# Patient Record
Sex: Female | Born: 1965 | Race: White | Hispanic: No | Smoking: Current every day smoker
Health system: Southern US, Community
[De-identification: ages and names within clinical notes are randomized; demographics above are authoritative.]

## PROBLEM LIST (undated history)

## (undated) DIAGNOSIS — I10 Essential (primary) hypertension: Secondary | ICD-10-CM

## (undated) DIAGNOSIS — J189 Pneumonia, unspecified organism: Secondary | ICD-10-CM

## (undated) HISTORY — PX: BACK SURGERY: SHX140

## (undated) HISTORY — PX: FOOT SURGERY: SHX648

---

## 2005-02-11 ENCOUNTER — Emergency Department: Payer: Self-pay | Admitting: Emergency Medicine

## 2005-04-17 ENCOUNTER — Emergency Department: Payer: Self-pay | Admitting: Emergency Medicine

## 2005-05-01 ENCOUNTER — Ambulatory Visit: Payer: Self-pay | Admitting: Orthopedic Surgery

## 2006-03-14 ENCOUNTER — Ambulatory Visit: Payer: Self-pay | Admitting: Family Medicine

## 2007-09-24 ENCOUNTER — Ambulatory Visit: Payer: Self-pay | Admitting: Pain Medicine

## 2008-01-13 ENCOUNTER — Ambulatory Visit: Payer: Self-pay | Admitting: Orthopedic Surgery

## 2008-06-08 ENCOUNTER — Ambulatory Visit: Payer: Self-pay | Admitting: Orthopedic Surgery

## 2008-06-11 ENCOUNTER — Ambulatory Visit: Payer: Self-pay | Admitting: Orthopedic Surgery

## 2009-06-13 ENCOUNTER — Emergency Department: Payer: Self-pay | Admitting: Emergency Medicine

## 2009-08-18 ENCOUNTER — Ambulatory Visit: Payer: Self-pay | Admitting: Orthopaedic Surgery

## 2010-07-11 ENCOUNTER — Ambulatory Visit: Payer: Self-pay | Admitting: Orthopaedic Surgery

## 2011-01-21 ENCOUNTER — Ambulatory Visit: Payer: Self-pay | Admitting: Orthopaedic Surgery

## 2011-10-11 ENCOUNTER — Ambulatory Visit: Payer: Self-pay | Admitting: Orthopaedic Surgery

## 2012-04-04 ENCOUNTER — Ambulatory Visit: Payer: Self-pay | Admitting: Orthopaedic Surgery

## 2012-09-21 ENCOUNTER — Ambulatory Visit: Payer: Self-pay

## 2015-05-23 ENCOUNTER — Emergency Department
Admission: EM | Admit: 2015-05-23 | Discharge: 2015-05-23 | Disposition: A | Discharge status: Home / Self Care | Payer: Medicare Other | Attending: Emergency Medicine | Admitting: Emergency Medicine

## 2015-05-23 ENCOUNTER — Other Ambulatory Visit: Payer: Self-pay

## 2015-05-23 ENCOUNTER — Encounter: Payer: Self-pay | Admitting: Emergency Medicine

## 2015-05-23 ENCOUNTER — Emergency Department: Payer: Medicare Other

## 2015-05-23 DIAGNOSIS — R0600 Dyspnea, unspecified: Secondary | ICD-10-CM | POA: Diagnosis present

## 2015-05-23 DIAGNOSIS — J209 Acute bronchitis, unspecified: Secondary | ICD-10-CM | POA: Diagnosis not present

## 2015-05-23 DIAGNOSIS — I10 Essential (primary) hypertension: Secondary | ICD-10-CM | POA: Diagnosis not present

## 2015-05-23 DIAGNOSIS — Z720 Tobacco use: Secondary | ICD-10-CM | POA: Insufficient documentation

## 2015-05-23 HISTORY — DX: Essential (primary) hypertension: I10

## 2015-05-23 HISTORY — DX: Pneumonia, unspecified organism: J18.9

## 2015-05-23 LAB — CBC WITH DIFFERENTIAL/PLATELET
BASOS ABS: 0 10*3/uL (ref 0–0.1)
BASOS PCT: 0 %
EOS ABS: 0 10*3/uL (ref 0–0.7)
Eosinophils Relative: 0 %
HCT: 42.4 % (ref 35.0–47.0)
HEMOGLOBIN: 14.2 g/dL (ref 12.0–16.0)
Lymphocytes Relative: 13 %
Lymphs Abs: 1.4 10*3/uL (ref 1.0–3.6)
MCH: 30.1 pg (ref 26.0–34.0)
MCHC: 33.4 g/dL (ref 32.0–36.0)
MCV: 90.1 fL (ref 80.0–100.0)
Monocytes Absolute: 0.1 10*3/uL — ABNORMAL LOW (ref 0.2–0.9)
Monocytes Relative: 1 %
NEUTROS PCT: 86 %
Neutro Abs: 9.1 10*3/uL — ABNORMAL HIGH (ref 1.4–6.5)
Platelets: 258 10*3/uL (ref 150–440)
RBC: 4.71 MIL/uL (ref 3.80–5.20)
RDW: 13.7 % (ref 11.5–14.5)
WBC: 10.6 10*3/uL (ref 3.6–11.0)

## 2015-05-23 LAB — COMPREHENSIVE METABOLIC PANEL
ALBUMIN: 4.9 g/dL (ref 3.5–5.0)
ALK PHOS: 115 U/L (ref 38–126)
ALT: 23 U/L (ref 14–54)
ANION GAP: 10 (ref 5–15)
AST: 24 U/L (ref 15–41)
BUN: 12 mg/dL (ref 6–20)
CALCIUM: 9.8 mg/dL (ref 8.9–10.3)
CO2: 25 mmol/L (ref 22–32)
Chloride: 106 mmol/L (ref 101–111)
Creatinine, Ser: 0.91 mg/dL (ref 0.44–1.00)
GFR calc Af Amer: >60 mL/min (ref >60)
GFR calc non Af Amer: >60 mL/min (ref >60)
GLUCOSE: 126 mg/dL — AB (ref 65–99)
Potassium: 3.8 mmol/L (ref 3.5–5.1)
Sodium: 141 mmol/L (ref 135–145)
Total Bilirubin: 0.2 mg/dL — ABNORMAL LOW (ref 0.3–1.2)
Total Protein: 8.4 g/dL — ABNORMAL HIGH (ref 6.5–8.1)

## 2015-05-23 LAB — FIBRIN DERIVATIVES D-DIMER (ARMC ONLY): Fibrin derivatives D-dimer (ARMC): 296 (ref 0–499)

## 2015-05-23 LAB — LACTIC ACID, PLASMA: Lactic Acid, Venous: 1.7 mmol/L (ref 0.5–2.0)

## 2015-05-23 MED ORDER — METHYLPREDNISOLONE SODIUM SUCC 125 MG IJ SOLR
125.0000 mg | Freq: Once | INTRAMUSCULAR | Status: AC
  Administered 2015-05-23: 125 mg via INTRAVENOUS
  Filled 2015-05-23: qty 2

## 2015-05-23 MED ORDER — PREDNISONE 20 MG PO TABS: 20.0000 mg | ORAL_TABLET | Freq: Two times a day (BID) | ORAL | Status: AC

## 2015-05-23 MED ORDER — KETOROLAC TROMETHAMINE 30 MG/ML IJ SOLN
30.0000 mg | Freq: Once | INTRAMUSCULAR | Status: AC
  Administered 2015-05-23: 30 mg via INTRAVENOUS
  Filled 2015-05-23: qty 1

## 2015-05-23 MED ORDER — ALBUTEROL SULFATE HFA 108 (90 BASE) MCG/ACT IN AERS: 2.0000 | INHALATION_SPRAY | Freq: Four times a day (QID) | RESPIRATORY_TRACT | Status: AC | PRN

## 2015-05-23 MED ORDER — SODIUM CHLORIDE 0.9 % IV BOLUS (SEPSIS)
500.0000 mL | Freq: Once | INTRAVENOUS | Status: AC
  Administered 2015-05-23: 500 mL via INTRAVENOUS

## 2015-05-23 MED ORDER — AZITHROMYCIN 500 MG PO TABS: 500.0000 mg | ORAL_TABLET | Freq: Every day | ORAL | Status: AC

## 2015-05-23 MED ORDER — ONDANSETRON HCL 4 MG/2ML IJ SOLN
4.0000 mg | Freq: Once | INTRAMUSCULAR | Status: AC
  Administered 2015-05-23: 4 mg via INTRAVENOUS
  Filled 2015-05-23: qty 2

## 2015-05-23 MED ORDER — IPRATROPIUM-ALBUTEROL 0.5-2.5 (3) MG/3ML IN SOLN
3.0000 mL | Freq: Once | RESPIRATORY_TRACT | Status: AC
Start: 2015-05-23 — End: 2015-05-23
  Administered 2015-05-23: 3 mL via RESPIRATORY_TRACT
  Filled 2015-05-23: qty 3

## 2015-05-23 MED ORDER — HYDROMORPHONE HCL 1 MG/ML IJ SOLN
1.0000 mg | Freq: Once | INTRAMUSCULAR | Status: AC
  Administered 2015-05-23: 1 mg via INTRAVENOUS
  Filled 2015-05-23: qty 1

## 2015-05-23 NOTE — Discharge Instructions (Signed)
Upper Respiratory Infection, Adult °Most upper respiratory infections (URIs) are a viral infection of the air passages leading to the lungs. A URI affects the nose, throat, and upper air passages. The most common type of URI is nasopharyngitis and is typically referred to as "the common cold." °URIs run their course and usually go away on their own. Most of the time, a URI does not require medical attention, but sometimes a bacterial infection in the upper airways can follow a viral infection. This is called a secondary infection. Sinus and middle ear infections are common types of secondary upper respiratory infections. °Bacterial pneumonia can also complicate a URI. A URI can worsen asthma and chronic obstructive pulmonary disease (COPD). Sometimes, these complications can require emergency medical care and may be life threatening.  °CAUSES °Almost all URIs are caused by viruses. A virus is a type of germ and can spread from one person to another.  °RISKS FACTORS °You may be at risk for a URI if:  °· You smoke.   °· You have chronic heart or lung disease. °· You have a weakened defense (immune) system.   °· You are very young or very old.   °· You have nasal allergies or asthma. °· You work in crowded or poorly ventilated areas. °· You work in health care facilities or schools. °SIGNS AND SYMPTOMS  °Symptoms typically develop 2-3 days after you come in contact with a cold virus. Most viral URIs last 7-10 days. However, viral URIs from the influenza virus (flu virus) can last 14-18 days and are typically more severe. Symptoms may include:  °· Runny or stuffy (congested) nose.   °· Sneezing.   °· Cough.   °· Sore throat.   °· Headache.   °· Fatigue.   °· Fever.   °· Loss of appetite.   °· Pain in your forehead, behind your eyes, and over your cheekbones (sinus pain). °· Muscle aches.   °DIAGNOSIS  °Your health care provider may diagnose a URI by: °· Physical exam. °· Tests to check that your symptoms are not due to  another condition such as: °¨ Strep throat. °¨ Sinusitis. °¨ Pneumonia. °¨ Asthma. °TREATMENT  °A URI goes away on its own with time. It cannot be cured with medicines, but medicines may be prescribed or recommended to relieve symptoms. Medicines may help: °· Reduce your fever. °· Reduce your cough. °· Relieve nasal congestion. °HOME CARE INSTRUCTIONS  °· Take medicines only as directed by your health care provider.   °· Gargle warm saltwater or take cough drops to comfort your throat as directed by your health care provider. °· Use a warm mist humidifier or inhale steam from a shower to increase air moisture. This may make it easier to breathe. °· Drink enough fluid to keep your urine clear or pale yellow.   °· Eat soups and other clear broths and maintain good nutrition.   °· Rest as needed.   °· Return to work when your temperature has returned to normal or as your health care provider advises. You may need to stay home longer to avoid infecting others. You can also use a face mask and careful hand washing to prevent spread of the virus. °· Increase the usage of your inhaler if you have asthma.   °· Do not use any tobacco products, including cigarettes, chewing tobacco, or electronic cigarettes. If you need help quitting, ask your health care provider. °PREVENTION  °The best way to protect yourself from getting a cold is to practice good hygiene.  °· Avoid oral or hand contact with people with cold   symptoms.   °· Wash your hands often if contact occurs.   °There is no clear evidence that vitamin C, vitamin E, echinacea, or exercise reduces the chance of developing a cold. However, it is always recommended to get plenty of rest, exercise, and practice good nutrition.  °SEEK MEDICAL CARE IF:  °· You are getting worse rather than better.   °· Your symptoms are not controlled by medicine.   °· You have chills. °· You have worsening shortness of breath. °· You have brown or red mucus. °· You have yellow or brown nasal  discharge. °· You have pain in your face, especially when you bend forward. °· You have a fever. °· You have swollen neck glands. °· You have pain while swallowing. °· You have white areas in the back of your throat. °SEEK IMMEDIATE MEDICAL CARE IF:  °· You have severe or persistent: °¨ Headache. °¨ Ear pain. °¨ Sinus pain. °¨ Chest pain. °· You have chronic lung disease and any of the following: °¨ Wheezing. °¨ Prolonged cough. °¨ Coughing up blood. °¨ A change in your usual mucus. °· You have a stiff neck. °· You have changes in your: °¨ Vision. °¨ Hearing. °¨ Thinking. °¨ Mood. °MAKE SURE YOU:  °· Understand these instructions. °· Will watch your condition. °· Will get help right away if you are not doing well or get worse. °  °This information is not intended to replace advice given to you by your health care provider. Make sure you discuss any questions you have with your health care provider. °  °Document Released: 01/24/2001 Document Revised: 12/15/2014 Document Reviewed: 11/05/2013 °Elsevier Interactive Patient Education ©2016 [employer] ° °Please return immediately if condition worsens. Please contact her primary physician or the physician you were given for referral. If you have any specialist physicians involved in her treatment and plan please also contact them. Thank you for using [facility]. ° °

## 2015-05-23 NOTE — ED Notes (Addendum)
Arrived via EMS from Urgent Care on [facility]. Pt diagnosed with PNA in the left lower lobe this week and over the past few days increased sxs and difficulty breathing.  Lethargic on arrival. Has been on albuterol, atbx, and steroids since Thursday. Duoneb given by EMS.

## 2015-05-23 NOTE — ED Provider Notes (Signed)
Time Seen:----------------------------------------- 3:21 PM on 05/23/2015 -----------------------------------------     I have reviewed the triage notes  Chief Complaint: Respiratory Distress   History of Present Illness: Carly Griffin is a 49 y.o. female who presents in respiratory distress. Patient has been diagnosed with pneumonia and was started on Z-Pak, prednisone, albuterol, and Tessalon Perles. She states her shortness of breath got worse and she was referred here to the emergency department was transported by EMS. Prior to arrival she received a DuoNeb. She states she's feels symptomatically improved though is somewhat limited historian due to her shortness of breath. He describes posterior left-sided chest discomfort mainly with coughing. She denies any nausea, vomiting, fever today. She is not currently on any home oxygen therapy. She has history of tobacco usage but is not diagnosed with COPD. She states she smokes less than a pack of cigarettes per day. She's had a history of multiple surgeries on her lower extremities and also on her back.   Past Medical History  Diagnosis Date  . Pneumonia   . Hypertension     There are no active problems to display for this patient.   Past Surgical History  Procedure Laterality Date  . Back surgery    . Foot surgery      Past Surgical History  Procedure Laterality Date  . Back surgery    . Foot surgery      No current outpatient prescriptions on file.  Allergies:  Review of patient's allergies indicates no known allergies.  Family History: History reviewed. No pertinent family history.  Social History: Social History  Substance Use Topics  . Smoking status: Current Every Day Smoker -- 0.50 packs/day    Types: Cigarettes  . Smokeless tobacco: None  . Alcohol Use: No     Review of Systems:   10 point review of systems was performed and was otherwise negative:  Constitutional: No fever Eyes: No visual  disturbances ENT: No sore throat, ear pain Cardiac: Posterior left-sided chest pain Respiratory: No shortness of breath, wheezing, or stridor Abdomen: No abdominal pain, no vomiting, No diarrhea Endocrine: No weight loss, No night sweats Extremities: No peripheral edema, cyanosis. No calf tenderness or posterior swelling. Skin: No rashes, easy bruising Neurologic: No focal weakness, trouble with speech or swollowing Urologic: No dysuria, Hematuria, or urinary frequency   Physical Exam:  ED Triage Vitals  Enc Vitals Group     BP 05/23/15 1455 157/137 mmHg     Pulse Rate 05/23/15 1455 103     Resp 05/23/15 1455 16     Temp 05/23/15 1455 97.7 F (36.5 C)     Temp Source 05/23/15 1455 Oral     SpO2 05/23/15 1448 93 %     Weight 05/23/15 1509 180 lb 4.8 oz (81.784 kg)     Height 05/23/15 1509  (1.549 m)     Head Cir --      Peak Flow --      Pain Score --      Pain Loc --      Pain Edu? --      Excl. in GC? --     General: Awake , Alert , and Oriented times 3; GCS 15 Head: Normal cephalic , atraumatic Eyes: Pupils equal , round, reactive to light Nose/Throat: No nasal drainage, patent upper airway without erythema or exudate.  Neck: Supple, Full range of motion, No anterior adenopathy or palpable thyroid masses Lungs: Coarse rhonchi auscultated bilaterally right side greater than  the left. Breath sounds are symmetric. Mild and expiratory wheezing at the apices Heart: Regular rate, regular rhythm without murmurs , gallops , or rubs Abdomen: Soft, non tender without rebound, guarding , or rigidity; bowel sounds positive and symmetric in all 4 quadrants. No organomegaly .        Extremities: 2 plus symmetric pulses. No edema, clubbing or cyanosis Neurologic: normal ambulation, Motor symmetric without deficits, sensory intact Skin: warm, dry, no rashes   Labs:   All laboratory work was reviewed including any pertinent negatives or positives listed below:  Labs Reviewed   CULTURE, BLOOD (ROUTINE X 2)  CULTURE, BLOOD (ROUTINE X 2)  CBC WITH DIFFERENTIAL/PLATELET  COMPREHENSIVE METABOLIC PANEL  FIBRIN DERIVATIVES D-DIMER ([facility] ONLY)  LACTIC ACID, PLASMA  LACTIC ACID, PLASMA   laboratory work reviewed shows no significant abnormalities. D-dimer test was 296 and with a low clinical suspicion I felt further investigation was not necessary  EKG:  ED ECG REPORT I, Jennye Moccasin, the attending physician, personally viewed and interpreted this ECG.  Date: 05/23/2015 EKG Time: 1505 Rate: 104 Rhythm: normal sinus rhythm QRS Axis: Low voltage QRS Intervals: normal ST/T Wave abnormalities: normal Conduction Disutrbances: none Narrative Interpretation: unremarkable    Radiology:  EXAM: CHEST 2 VIEW  COMPARISON: 03/14/2006  FINDINGS: Heart size is normal. Mediastinal shadows are unremarkable. There is abnormal density both lung bases with volume loss and linear markings. The findings could represent scarring, atelectasis or bibasilar pneumonia. No dense consolidation or lobar collapse. No effusion. Spinal stimulator is seen in the lower thoracic region.  IMPRESSION: Volume loss at the lung bases with linear markings. The findings could represent scarring, sterile atelectasis or atelectatic bibasilar pneumonia.   I personally reviewed the radiologic studies    ED Course:  Patient was placed on continuous pulse oximetry and 2 L nasal cannula. She was given an additional DuoNeb prior to discharge and essentially was observed after her first DuoNeb treatment and was given a boost of her steroids. Review of her chest x-ray shows some small amount of platelike atelectasis in the left lower lobe with no indications of a significant infiltrate. Supplemental oxygen and observed with ambulation and the lowest pulse oximetry was 94% on room air. Patient I felt was on the appropriate treatment program and will continue her Zithromax for atypical  pneumonias versus pertussis  etc. She was also given an extension on her steroids and was given a prescription for another inhaler. Patient I felt did not require inpatient management. Her differential included acute bronchitis with bronchospasm, community-acquired pneumonia, pulmonary embolism, pulmonary edema. Given her current clinical presentation and objective findings I felt this was bronchitis with bronchospasm with a history of tobacco abuse.   Assessment: Acute bronchitis with bronchospasm     Plan: Outpatient management Patient was advised to return immediately if condition worsens. Patient was advised to follow up with her primary care physician or other specialized physicians involved and in their current assessment.             Jennye Moccasin, MD 05/23/15 [phone]

## 2015-05-28 LAB — CULTURE, BLOOD (ROUTINE X 2)
CULTURE: NO GROWTH
CULTURE: NO GROWTH

## 2016-03-30 ENCOUNTER — Other Ambulatory Visit: Payer: Self-pay | Admitting: Pain Medicine

## 2016-03-30 DIAGNOSIS — T85192A Other mechanical complication of implanted electronic neurostimulator (electrode) of spinal cord, initial encounter: Secondary | ICD-10-CM

## 2016-05-03 ENCOUNTER — Other Ambulatory Visit: Payer: Self-pay | Admitting: Pain Medicine

## 2016-05-03 DIAGNOSIS — IMO0002 Reserved for concepts with insufficient information to code with codable children: Secondary | ICD-10-CM

## 2016-05-05 ENCOUNTER — Ambulatory Visit: Admission: RE | Admit: 2016-05-05 | Payer: Medicare PPO | Source: Ambulatory Visit

## 2016-12-10 IMAGING — CR DG CHEST 2V
2 series · 2 of 2 positions shown · non-contrast
Comparison: 03/14/2006

CLINICAL DATA: Diagnosed with pneumonia last week with worsening
symptoms. Worsening shortness of breath.

EXAM:
CHEST  2 VIEW

[chest pa]
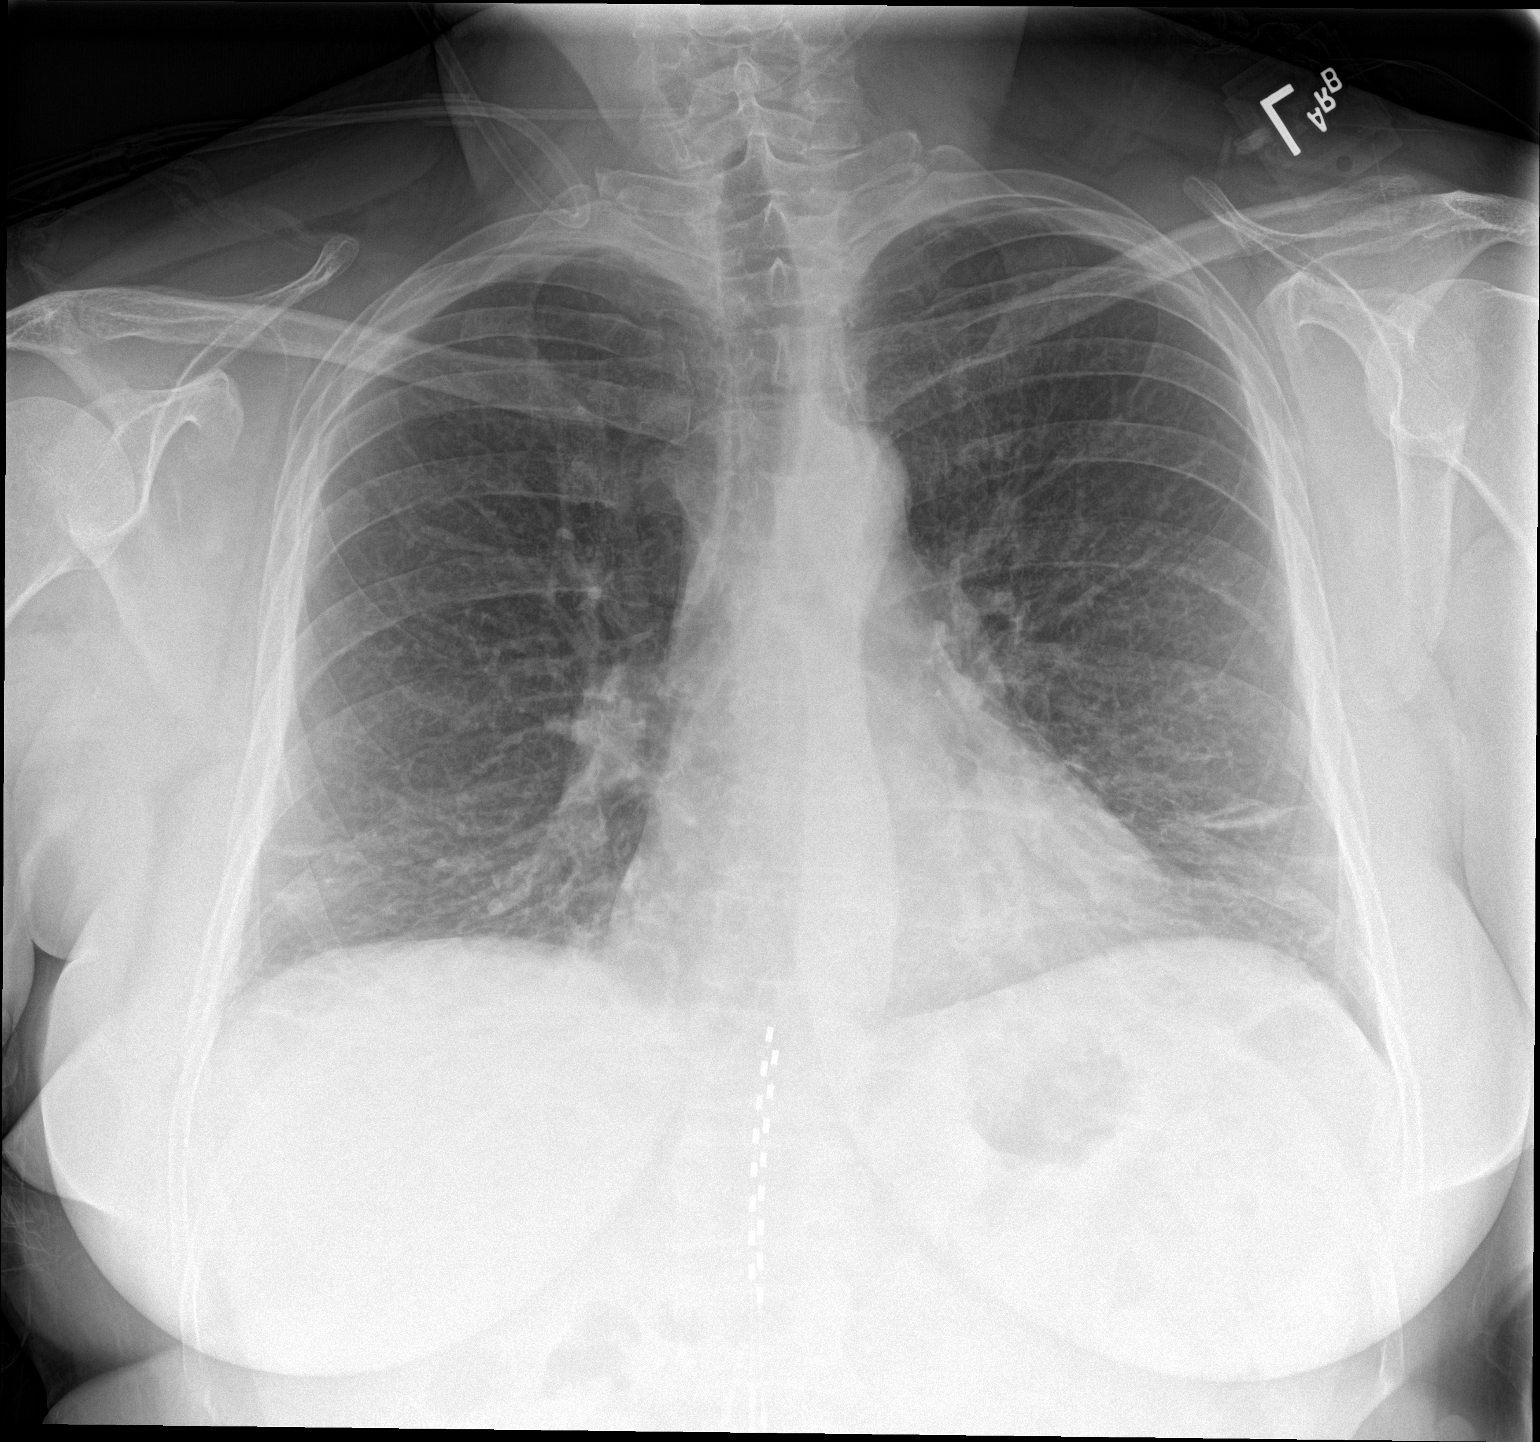

[chest lat]
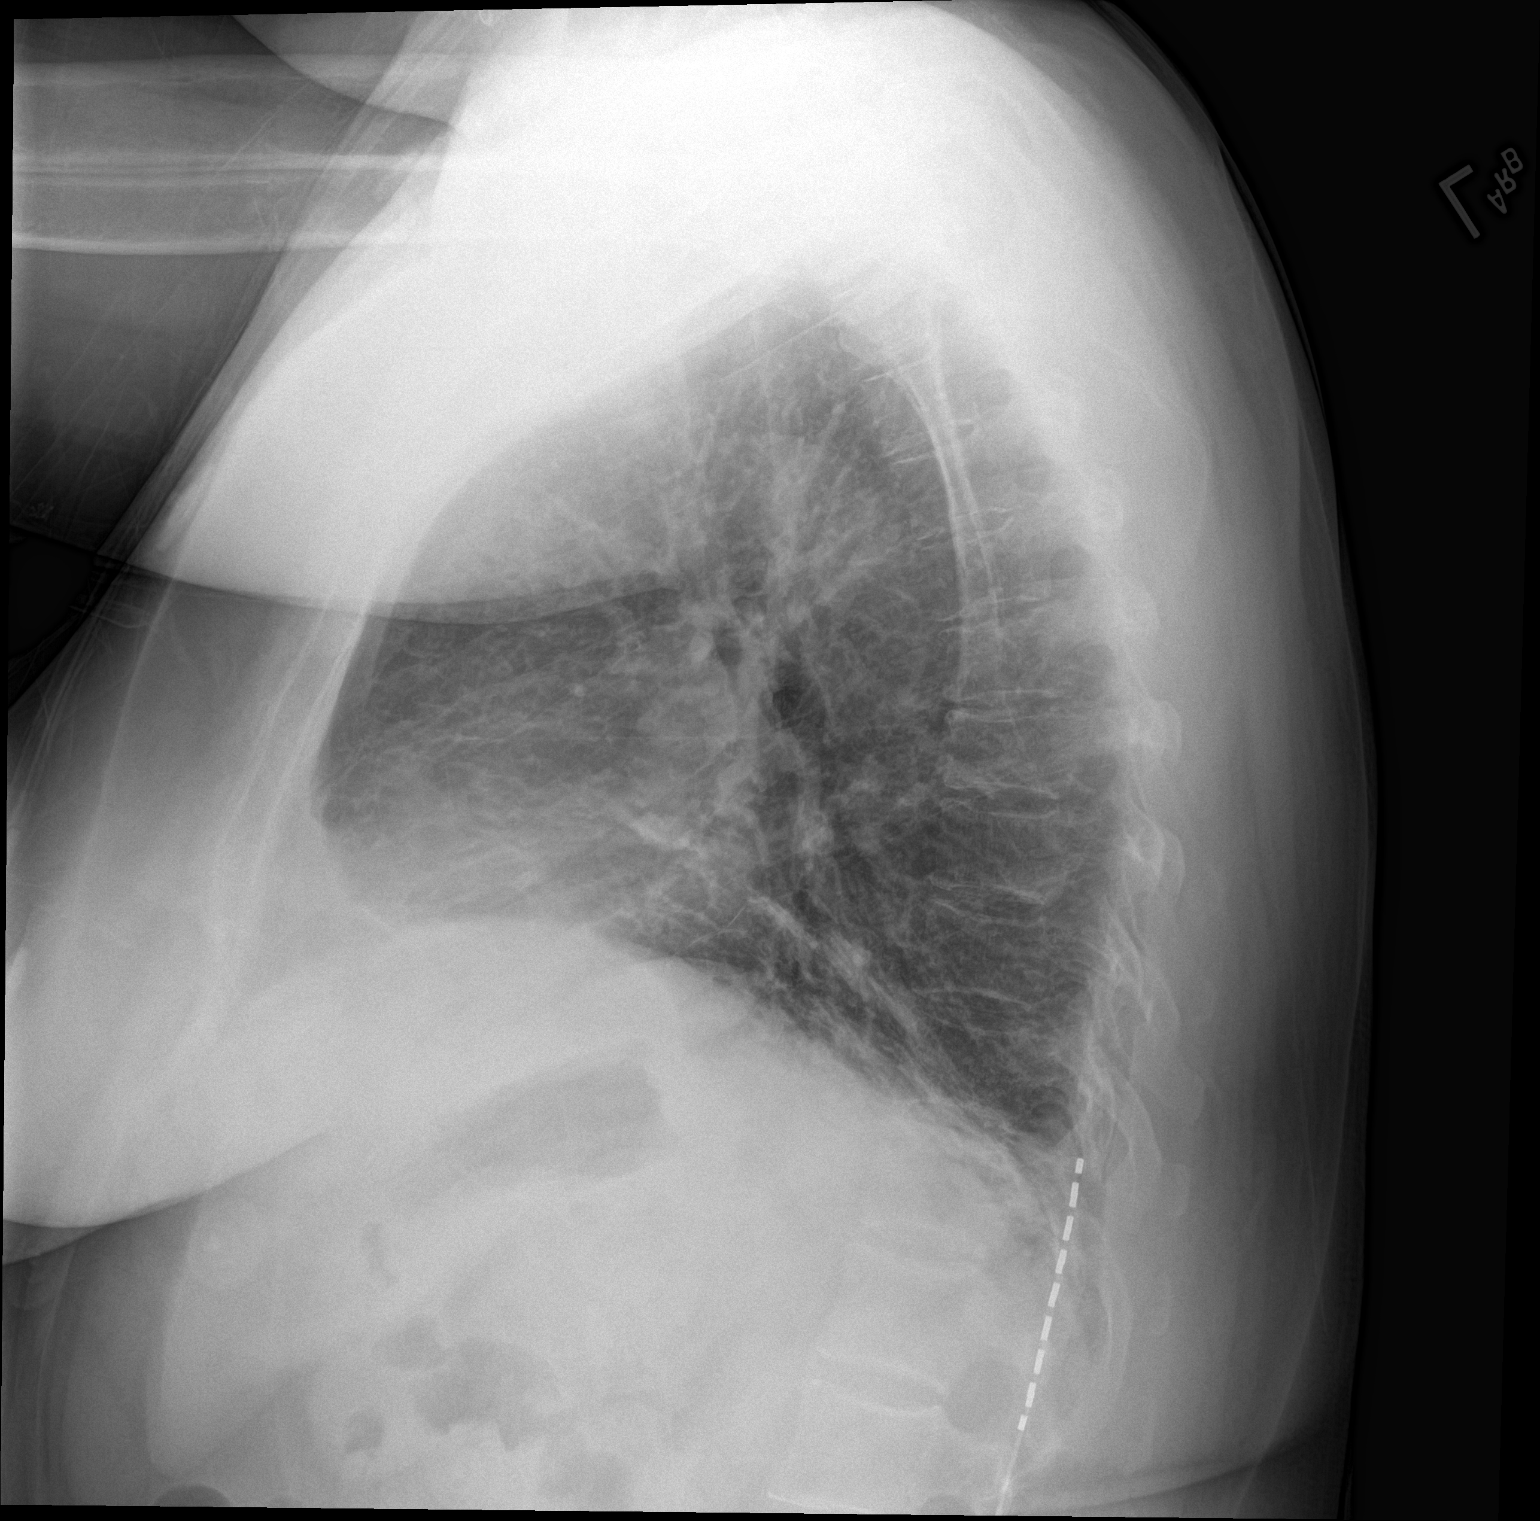

[2 of 2 positions shown; findings below may reference images not displayed]

FINDINGS: Heart size is normal. Mediastinal shadows are unremarkable. There is
abnormal density both lung bases with volume loss and linear
markings. The findings could represent scarring, atelectasis or
bibasilar pneumonia. No dense consolidation or lobar collapse. No
effusion. Spinal stimulator is seen in the lower thoracic region.
IMPRESSION: Volume loss at the lung bases with linear markings. The findings
could represent scarring, sterile atelectasis or atelectatic
bibasilar pneumonia.
